# Patient Record
Sex: Male | Born: 1965 | Race: White | Hispanic: No | Marital: Married | Smoking: Never smoker
Health system: Southern US, Community
[De-identification: ages and names within clinical notes are randomized; demographics above are authoritative.]

## PROBLEM LIST (undated history)

## (undated) DIAGNOSIS — N200 Calculus of kidney: Secondary | ICD-10-CM

## (undated) DIAGNOSIS — E785 Hyperlipidemia, unspecified: Secondary | ICD-10-CM

## (undated) DIAGNOSIS — M5430 Sciatica, unspecified side: Secondary | ICD-10-CM

## (undated) DIAGNOSIS — I10 Essential (primary) hypertension: Secondary | ICD-10-CM

## (undated) HISTORY — DX: Sciatica, unspecified side: M54.30

## (undated) HISTORY — PX: VASECTOMY: SHX75

## (undated) HISTORY — PX: HEMORRHOID SURGERY: SHX153

## (undated) HISTORY — DX: Hyperlipidemia, unspecified: E78.5

## (undated) HISTORY — DX: Essential (primary) hypertension: I10

---

## 2006-11-03 ENCOUNTER — Emergency Department: Payer: Self-pay | Admitting: Emergency Medicine

## 2006-11-23 ENCOUNTER — Observation Stay: Payer: Self-pay | Admitting: Surgery

## 2007-01-28 ENCOUNTER — Ambulatory Visit: Payer: Self-pay | Admitting: Surgery

## 2007-05-12 ENCOUNTER — Ambulatory Visit: Payer: Self-pay | Admitting: Gastroenterology

## 2009-05-02 ENCOUNTER — Ambulatory Visit: Payer: Self-pay | Admitting: Surgery

## 2010-05-03 ENCOUNTER — Ambulatory Visit: Payer: Self-pay | Admitting: Family Medicine

## 2012-06-30 DIAGNOSIS — I10 Essential (primary) hypertension: Secondary | ICD-10-CM | POA: Insufficient documentation

## 2012-06-30 DIAGNOSIS — F419 Anxiety disorder, unspecified: Secondary | ICD-10-CM | POA: Insufficient documentation

## 2012-06-30 DIAGNOSIS — K760 Fatty (change of) liver, not elsewhere classified: Secondary | ICD-10-CM | POA: Insufficient documentation

## 2014-03-23 DIAGNOSIS — R739 Hyperglycemia, unspecified: Secondary | ICD-10-CM | POA: Insufficient documentation

## 2014-03-23 DIAGNOSIS — R109 Unspecified abdominal pain: Secondary | ICD-10-CM | POA: Insufficient documentation

## 2015-12-30 DIAGNOSIS — F321 Major depressive disorder, single episode, moderate: Secondary | ICD-10-CM | POA: Insufficient documentation

## 2019-06-17 ENCOUNTER — Other Ambulatory Visit: Payer: Self-pay

## 2019-06-17 ENCOUNTER — Emergency Department: Payer: PRIVATE HEALTH INSURANCE

## 2019-06-17 ENCOUNTER — Emergency Department
Admission: EM | Admit: 2019-06-17 | Discharge: 2019-06-17 | Disposition: A | Discharge status: Home / Self Care | Payer: PRIVATE HEALTH INSURANCE | Attending: Emergency Medicine | Admitting: Emergency Medicine

## 2019-06-17 ENCOUNTER — Encounter: Payer: Self-pay | Admitting: Emergency Medicine

## 2019-06-17 DIAGNOSIS — R319 Hematuria, unspecified: Secondary | ICD-10-CM

## 2019-06-17 DIAGNOSIS — R109 Unspecified abdominal pain: Secondary | ICD-10-CM

## 2019-06-17 DIAGNOSIS — R1011 Right upper quadrant pain: Secondary | ICD-10-CM | POA: Diagnosis present

## 2019-06-17 DIAGNOSIS — N201 Calculus of ureter: Secondary | ICD-10-CM | POA: Diagnosis not present

## 2019-06-17 HISTORY — DX: Calculus of kidney: N20.0

## 2019-06-17 LAB — URINALYSIS, COMPLETE (UACMP) WITH MICROSCOPIC
Bacteria, UA: NONE SEEN
Bilirubin Urine: NEGATIVE
Glucose, UA: NEGATIVE mg/dL
Ketones, ur: NEGATIVE mg/dL
Leukocytes,Ua: NEGATIVE
Nitrite: NEGATIVE
Protein, ur: 100 mg/dL — AB
RBC / HPF: >50 RBC/hpf — ABNORMAL HIGH (ref 0–5)
Specific Gravity, Urine: 1.027 (ref 1.005–1.030)
pH: 5 (ref 5.0–8.0)

## 2019-06-17 MED ORDER — KETOROLAC TROMETHAMINE 10 MG PO TABS: 10.0000 mg | ORAL_TABLET | Freq: Four times a day (QID) | ORAL | 0 refills | Status: DC | PRN

## 2019-06-17 MED ORDER — MORPHINE SULFATE (PF) 4 MG/ML IV SOLN
4.0000 mg | Freq: Once | INTRAVENOUS | Status: AC
  Administered 2019-06-17: 08:00:00 4 mg via INTRAVENOUS
  Filled 2019-06-17: qty 1

## 2019-06-17 MED ORDER — ONDANSETRON 4 MG PO TBDP: 4.0000 mg | ORAL_TABLET | Freq: Three times a day (TID) | ORAL | 0 refills | Status: DC | PRN

## 2019-06-17 MED ORDER — ONDANSETRON HCL 4 MG/2ML IJ SOLN
4.0000 mg | Freq: Once | INTRAMUSCULAR | Status: AC
  Administered 2019-06-17: 08:00:00 4 mg via INTRAVENOUS
  Filled 2019-06-17: qty 2

## 2019-06-17 MED ORDER — KETOROLAC TROMETHAMINE 30 MG/ML IJ SOLN
15.0000 mg | INTRAMUSCULAR | Status: AC
  Administered 2019-06-17: 08:00:00 15 mg via INTRAVENOUS
  Filled 2019-06-17: qty 1

## 2019-06-17 MED ORDER — SODIUM CHLORIDE 0.9 % IV BOLUS
1000.0000 mL | Freq: Once | INTRAVENOUS | Status: AC
  Administered 2019-06-17: 1000 mL via INTRAVENOUS

## 2019-06-17 MED ORDER — TAMSULOSIN HCL 0.4 MG PO CAPS: 0.4000 mg | ORAL_CAPSULE | Freq: Every day | ORAL | 0 refills | Status: AC

## 2019-06-17 MED ORDER — OXYCODONE-ACETAMINOPHEN 5-325 MG PO TABS: 1.0000 | ORAL_TABLET | Freq: Four times a day (QID) | ORAL | 0 refills | Status: AC | PRN

## 2019-06-17 NOTE — ED Provider Notes (Signed)
[facility] Emergency Department Provider Note  ____________________________________________  Time seen: Approximately 10:02 AM  I have reviewed the triage vital signs and the nursing notes.   HISTORY  Chief Complaint Flank Pain    HPI Scott Kane is a 54 y.o. male with a history of kidney stones reports he was in his usual state of health until he was awakened at this morning at 5:45 AM with sharp right flank pain radiating into the right groin.  Associated diaphoresis.  10/10.  Waxing and waning, no aggravating or alleviating factors, feels like a kidney stone.  Denies dysuria fevers or chills.      Past Medical History:  Diagnosis Date  . Kidney stone      There are no problems to display for this patient.    No history of urologic intervention.   Prior to Admission medications   Medication Sig Start Date End Date Taking? Authorizing Provider  ketorolac (TORADOL) 10 MG tablet Take 1 tablet (10 mg total) by mouth every 6 (six) hours as needed for moderate pain. 06/17/19   Carrie Mew, MD  ondansetron (ZOFRAN ODT) 4 MG disintegrating tablet Take 1 tablet (4 mg total) by mouth every 8 (eight) hours as needed for nausea or vomiting. 06/17/19   Carrie Mew, MD  oxyCODONE-acetaminophen (PERCOCET) 5-325 MG tablet Take 1 tablet by mouth every 6 (six) hours as needed for up to 2 days for severe pain. 06/17/19 06/19/19  Carrie Mew, MD  tamsulosin (FLOMAX) 0.4 MG CAPS capsule Take 1 capsule (0.4 mg total) by mouth daily for 10 days. Discontinue after symptoms improve 06/17/19 06/27/19  Carrie Mew, MD     Allergies Patient has no known allergies.   No family history on file.  Social History Social History   Tobacco Use  . Smoking status: Not on file  Substance Use Topics  . Alcohol use: Not on file  . Drug use: Not on file    Review of Systems  Constitutional:   No fever or chills.  ENT:   No sore throat. No  rhinorrhea. Cardiovascular:   No chest pain or syncope. Respiratory:   No dyspnea or cough. Gastrointestinal:   Negative for abdominal pain, vomiting and diarrhea.  Musculoskeletal:   Right flank pain as above. All other systems reviewed and are negative except as documented above in ROS and HPI.  ____________________________________________   PHYSICAL EXAM:  VITAL SIGNS: ED Triage Vitals  Enc Vitals Group     BP 06/17/19 0714 (!) 220/111     Pulse Rate 06/17/19 0705 77     Resp 06/17/19 0705 18     Temp 06/17/19 0705 98.5 F (36.9 C)     Temp Source 06/17/19 0705 Oral     SpO2 06/17/19 0705 94 %     Weight 06/17/19 0702 230 lb (104.3 kg)     Height 06/17/19 0702 5\' 7"  (1.702 m)     Head Circumference --      Peak Flow --      Pain Score 06/17/19 0702 10     Pain Loc --      Pain Edu? --      Excl. in [place]? --     Vital signs reviewed, nursing assessments reviewed.   Constitutional:   Alert and oriented. Non-toxic appearance. Eyes:   Conjunctivae are normal. EOMI. PERRL. ENT      Head:   Normocephalic and atraumatic.      Nose:   Wearing a mask.  Mouth/Throat:   Wearing a mask.      Neck:   No meningismus. Full ROM. Hematological/Lymphatic/Immunilogical:   No cervical lymphadenopathy. Cardiovascular:   RRR. Symmetric bilateral radial and DP pulses.  No murmurs. Cap refill less than 2 seconds. Respiratory:   Normal respiratory effort without tachypnea/retractions. Breath sounds are clear and equal bilaterally. No wheezes/rales/rhonchi. Gastrointestinal:   Soft with right lower quadrant tenderness. Non distended. There is no CVA tenderness.  No rebound, rigidity, or guarding.  Musculoskeletal:   Normal range of motion in all extremities. No joint effusions.  No lower extremity tenderness.  No edema. Neurologic:   Normal speech and language.  Motor grossly intact. No acute focal neurologic deficits are appreciated.  Skin:    Skin is warm, dry and intact. No rash  noted.  No petechiae, purpura, or bullae.  ____________________________________________    LABS (pertinent positives/negatives) (all labs ordered are listed, but only abnormal results are displayed) Labs Reviewed  URINALYSIS, COMPLETE (UACMP) WITH MICROSCOPIC - Abnormal; Notable for the following components:      Result Value   Color, Urine AMBER (*)    APPearance CLOUDY (*)    Hgb urine dipstick LARGE (*)    Protein, ur 100 (*)    RBC / HPF >50 (*)    All other components within normal limits  URINE CULTURE   ____________________________________________   EKG    ____________________________________________    RADIOLOGY  CT Renal Stone Study  Result Date: 06/17/2019 CLINICAL DATA:  Acute right flank pain. EXAM: CT ABDOMEN AND PELVIS WITHOUT CONTRAST TECHNIQUE: Multidetector CT imaging of the abdomen and pelvis was performed following the standard protocol without IV contrast. COMPARISON:  None. FINDINGS: Lower chest: No acute abnormality. Hepatobiliary: No focal liver abnormality is seen. No gallstones, gallbladder wall thickening, or biliary dilatation. Pancreas: Unremarkable. No pancreatic ductal dilatation or surrounding inflammatory changes. Spleen: Normal in size without focal abnormality. Adrenals/Urinary Tract: Adrenal glands appear normal. Mild right hydroureteronephrosis is noted secondary to 5 mm distal right ureteral calculus. Multiple rounded low densities are noted most consistent with cysts, but renal ultrasound is recommended to rule out potential mass. Urinary bladder is decompressed. Stomach/Bowel: Stomach is within normal limits. Appendix appears normal. No evidence of bowel wall thickening, distention, or inflammatory changes. Vascular/Lymphatic: No significant vascular findings are present. No enlarged abdominal or pelvic lymph nodes. Reproductive: Prostate is unremarkable. Other: No abdominal wall hernia or abnormality. No abdominopelvic ascites. Musculoskeletal:  No acute or significant osseous findings. IMPRESSION: 1. Mild right hydroureteronephrosis is noted secondary to 5 mm distal right ureteral calculus. 2. Multiple rounded low densities are noted in both kidneys most consistent with cysts, but renal ultrasound is recommended to rule out potential mass. Electronically Signed   By: Lupita Raider M.D.   On: 06/17/2019 08:25    ____________________________________________   PROCEDURES Procedures  ____________________________________________  DIFFERENTIAL DIAGNOSIS   Ureterolithiasis, ureteral obstruction, appendicitis, diverticulitis  CLINICAL IMPRESSION / ASSESSMENT AND PLAN / ED COURSE  Medications ordered in the ED: Medications  sodium chloride 0.9 % bolus 1,000 mL (0 mLs Intravenous Stopped 06/17/19 0930)  morphine 4 MG/ML injection 4 mg (4 mg Intravenous Given 06/17/19 0745)  ondansetron (ZOFRAN) injection 4 mg (4 mg Intravenous Given 06/17/19 0745)  ketorolac (TORADOL) 30 MG/ML injection 15 mg (15 mg Intravenous Given 06/17/19 2957)    Pertinent labs & imaging results that were available during my care of the patient were reviewed by me and considered in my medical decision making (see chart for  details).  Scott Kane was evaluated in Emergency Department on 06/17/2019 for the symptoms described in the history of present illness. He was evaluated in the context of the global COVID-19 pandemic, which necessitated consideration that the patient might be at risk for infection with the SARS-CoV-2 virus that causes COVID-19. Institutional protocols and algorithms that pertain to the evaluation of patients at risk for COVID-19 are in a state of rapid change based on information released by regulatory bodies including the CDC and federal and state organizations. These policies and algorithms were followed during the patient's care in the ED.   Patient presents with severe right flank pain, most likely renal colic.  However with location and  tenderness, CT scan obtained which shows no evidence of appendicitis or other inflammatory process, but does confirm a 5 mm stone in the distal right ureter explaining the symptoms.  He has hematuria as well.  Will treat with Toradol, Zofran, short course of Percocet as needed.  Follow-up with urology and primary care for further management and future urinary testing.      ____________________________________________   FINAL CLINICAL IMPRESSION(S) / ED DIAGNOSES    Final diagnoses:  Right flank pain  Hematuria, unspecified type  Ureterolithiasis     ED Discharge Orders         Ordered    ketorolac (TORADOL) 10 MG tablet  Every 6 hours PRN     06/17/19 1002    oxyCODONE-acetaminophen (PERCOCET) 5-325 MG tablet  Every 6 hours PRN     06/17/19 1002    tamsulosin (FLOMAX) 0.4 MG CAPS capsule  Daily     06/17/19 1002    ondansetron (ZOFRAN ODT) 4 MG disintegrating tablet  Every 8 hours PRN     06/17/19 1002          Portions of this note were generated with dragon dictation software. Dictation errors may occur despite best attempts at proofreading.   Sharman Cheek, MD 06/17/19 1005

## 2019-06-17 NOTE — ED Triage Notes (Signed)
Pt to triage via w/c, diaphoretic, appears uncomfortable; pt reports rt flank pain radiating around into groin/testicle since 545am; st hx kidney stones

## 2019-06-17 NOTE — Discharge Instructions (Signed)
Your CT scan shows a 5 mm kidney stone on the right which is near your bladder.  Once it passes into your bladder, your symptoms should significantly improve.  Continue to drink lots of fluids to stay well-hydrated and take medicines as prescribed for your pain.  Follow-up with urology next week.  You should have a repeat urine test in a week or so after you are feeling back to normal to ensure the blood in your urine has resolved.

## 2019-06-18 LAB — URINE CULTURE

## 2020-07-22 IMAGING — CT CT RENAL STONE PROTOCOL
2 of 4 series · 16 of 46 positions shown, 18 images · non-contrast
Comparison: None.

CLINICAL DATA: Acute right flank pain.

EXAM:
CT ABDOMEN AND PELVIS WITHOUT CONTRAST
TECHNIQUE: Multidetector CT imaging of the abdomen and pelvis was performed
following the standard protocol without IV contrast.

[Series 2: stone full standard · axial · 0.79mm/px · z∈[-517,-67]mm · 13 of 100 slices shown, 15 images]
[im 5/100  soft-tissue]
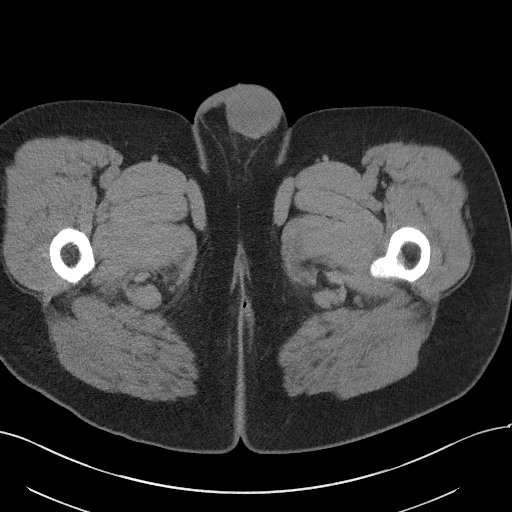
[im 5/100  bone]
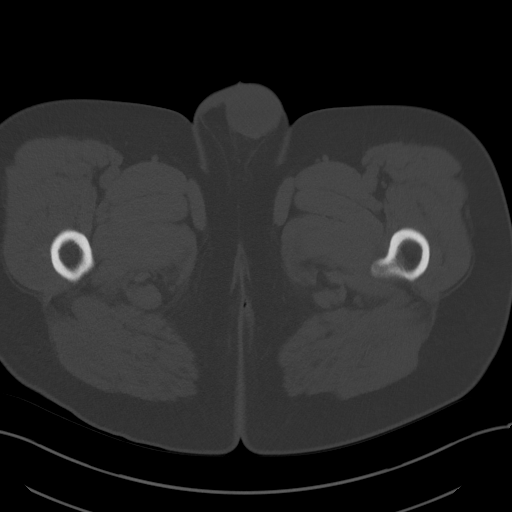
[im 13/100  soft-tissue]
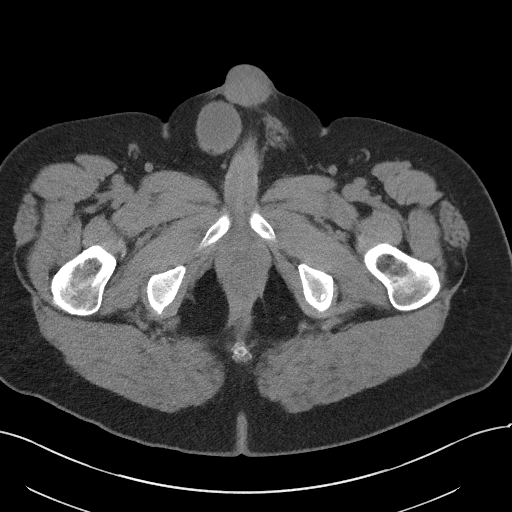
[im 22/100  soft-tissue]
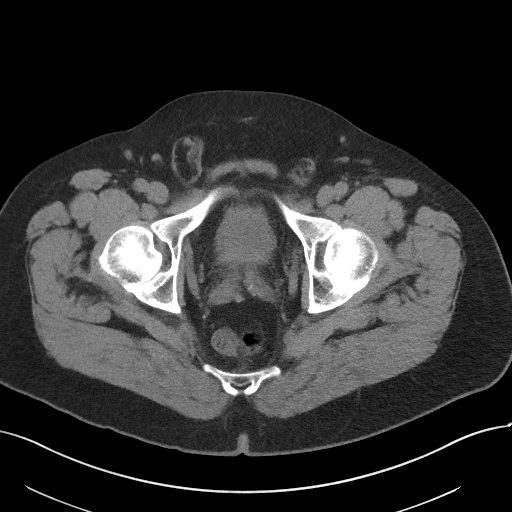
[im 26/100  soft-tissue]
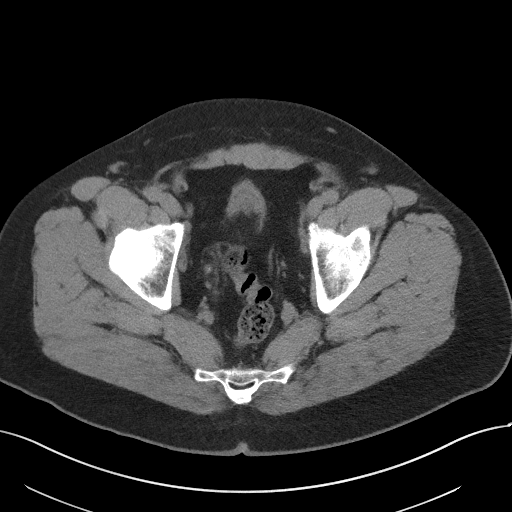
[im 35/100  soft-tissue]
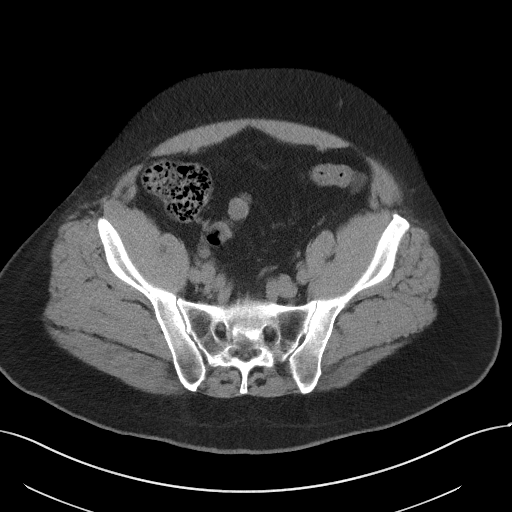
[im 44/100  soft-tissue]
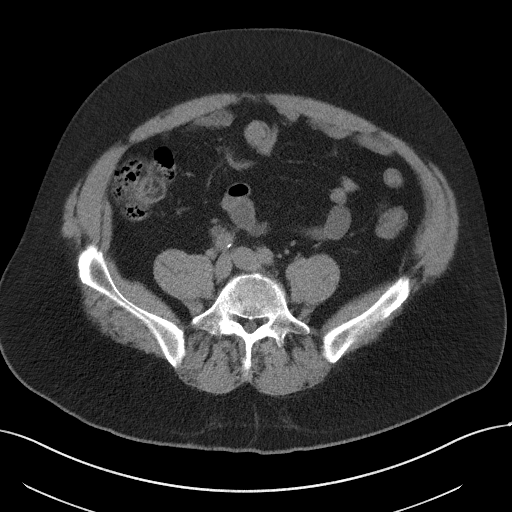
[im 52/100  soft-tissue]
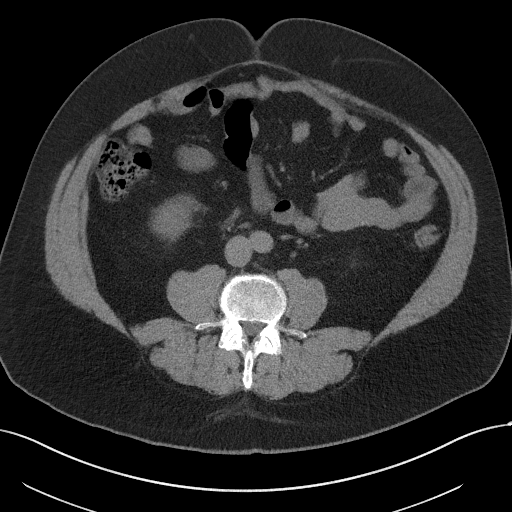
[im 56/100  soft-tissue]
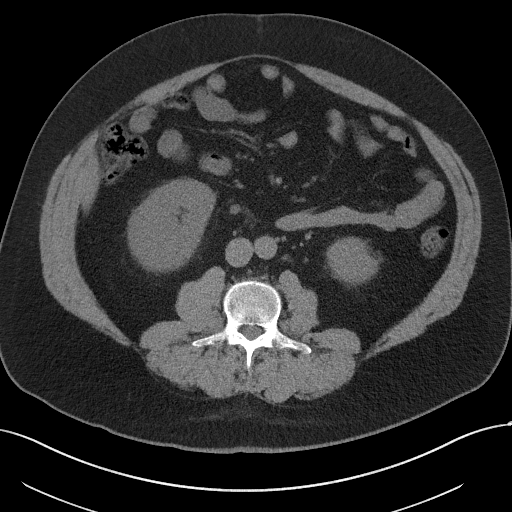
[im 65/100  soft-tissue]
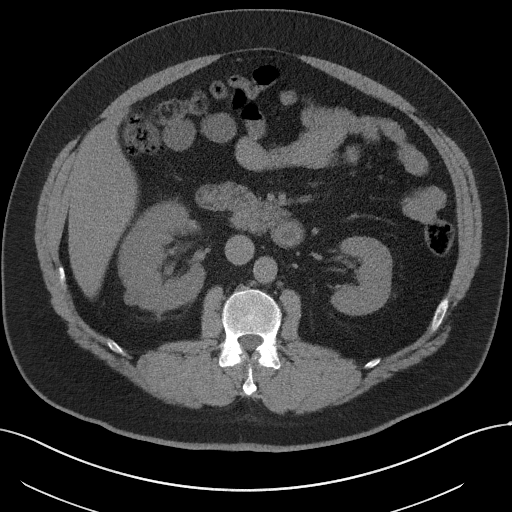
[im 65/100  bone]
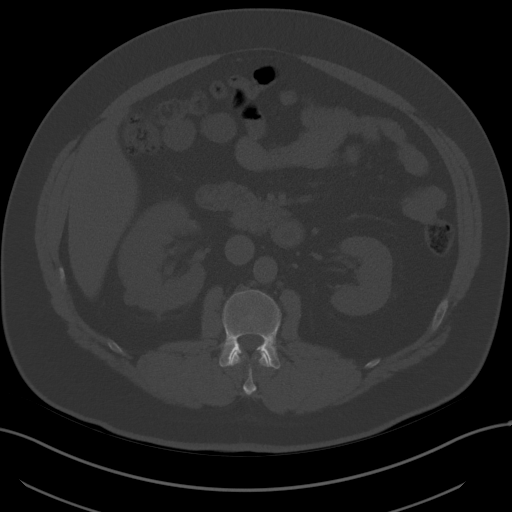
[im 74/100  soft-tissue]
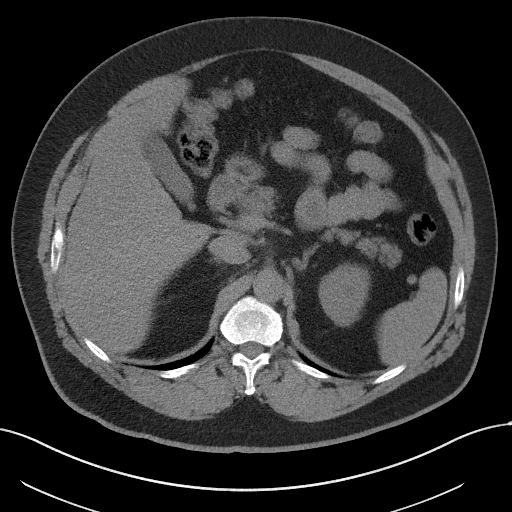
[im 78/100  soft-tissue]
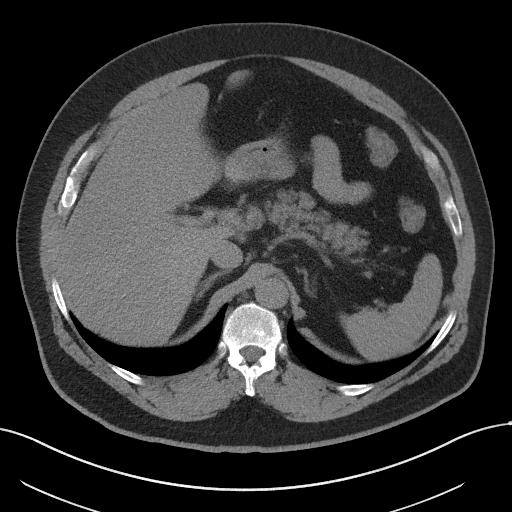
[im 87/100  soft-tissue]
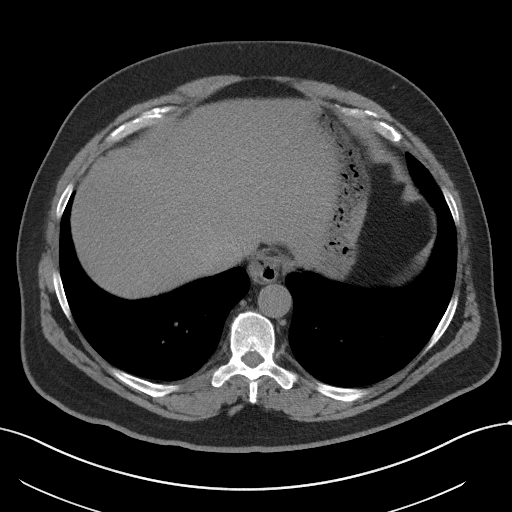
[im 95/100  soft-tissue]
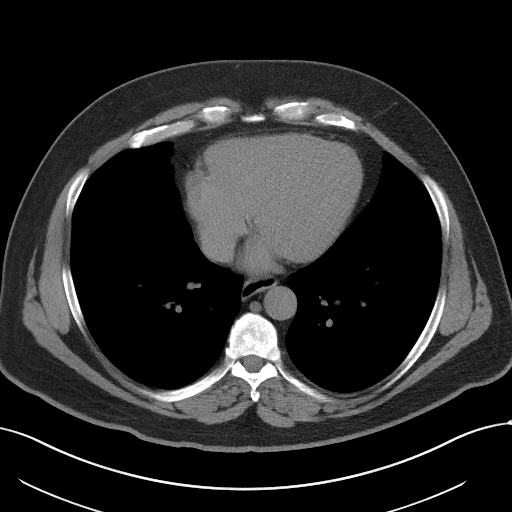

[Series 5: coronal · coronal · 0.92mm/px · 3 of 179 slices shown]
[im 60/179  soft-tissue]
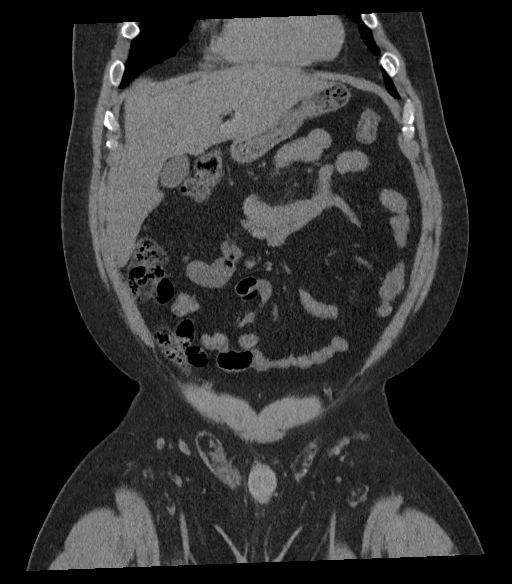
[im 80/179  soft-tissue]
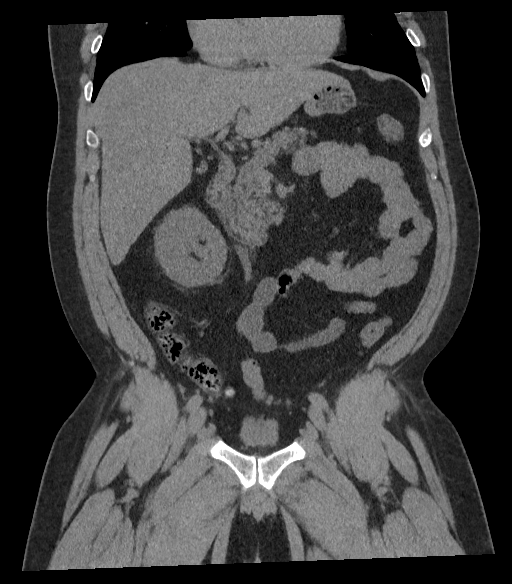
[im 99/179  soft-tissue]
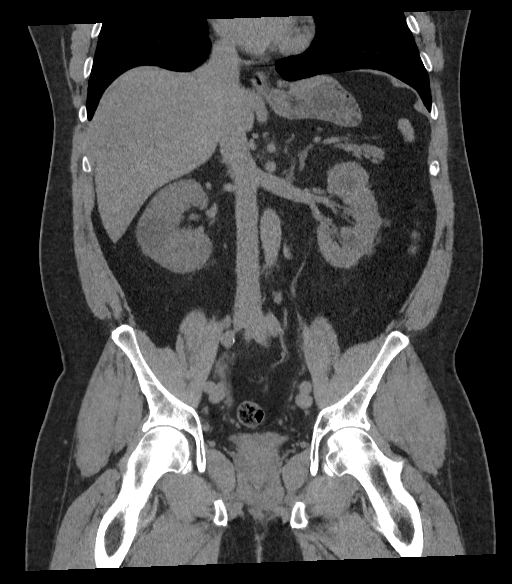

[16 of 46 positions shown; findings below may reference images not displayed]

FINDINGS: Lower chest: No acute abnormality.

Hepatobiliary: No focal liver abnormality is seen. No gallstones,
gallbladder wall thickening, or biliary dilatation.

Pancreas: Unremarkable. No pancreatic ductal dilatation or
surrounding inflammatory changes.

Spleen: Normal in size without focal abnormality.

Adrenals/Urinary Tract: Adrenal glands appear normal. Mild right
hydroureteronephrosis is noted secondary to 5 mm distal right
ureteral calculus. Multiple rounded low densities are noted most
consistent with cysts, but renal ultrasound is recommended to rule
out potential mass. Urinary bladder is decompressed.

Stomach/Bowel: Stomach is within normal limits. Appendix appears
normal. No evidence of bowel wall thickening, distention, or
inflammatory changes.

Vascular/Lymphatic: No significant vascular findings are present. No
enlarged abdominal or pelvic lymph nodes.

Reproductive: Prostate is unremarkable.

Other: No abdominal wall hernia or abnormality. No abdominopelvic
ascites.

Musculoskeletal: No acute or significant osseous findings.
IMPRESSION: 1. Mild right hydroureteronephrosis is noted secondary to 5 mm
distal right ureteral calculus.
2. Multiple rounded low densities are noted in both kidneys most
consistent with cysts, but renal ultrasound is recommended to rule
out potential mass.

## 2020-10-04 DIAGNOSIS — G43909 Migraine, unspecified, not intractable, without status migrainosus: Secondary | ICD-10-CM | POA: Insufficient documentation

## 2020-11-02 DIAGNOSIS — N62 Hypertrophy of breast: Secondary | ICD-10-CM | POA: Insufficient documentation

## 2021-06-23 ENCOUNTER — Ambulatory Visit
Admission: EM | Admit: 2021-06-23 | Discharge: 2021-06-23 | Disposition: A | Discharge status: Home / Self Care | Payer: 59

## 2021-06-23 ENCOUNTER — Encounter: Payer: Self-pay | Admitting: Emergency Medicine

## 2021-06-23 ENCOUNTER — Other Ambulatory Visit: Payer: Self-pay

## 2021-06-23 DIAGNOSIS — L0100 Impetigo, unspecified: Secondary | ICD-10-CM

## 2021-06-23 DIAGNOSIS — B3749 Other urogenital candidiasis: Secondary | ICD-10-CM

## 2021-06-23 MED ORDER — CEPHALEXIN 500 MG PO CAPS: 500.0000 mg | ORAL_CAPSULE | Freq: Two times a day (BID) | ORAL | 0 refills | Status: AC

## 2021-06-23 MED ORDER — FLUCONAZOLE 150 MG PO TABS: 150.0000 mg | ORAL_TABLET | Freq: Every day | ORAL | 0 refills | Status: AC

## 2021-06-23 MED ORDER — PREDNISONE 20 MG PO TABS: 40.0000 mg | ORAL_TABLET | Freq: Every day | ORAL | 0 refills | Status: DC

## 2021-06-23 NOTE — Discharge Instructions (Signed)
For your wrist  -Take Keflex twice a day for the next 5 days to cover for bacterial infection -Starting tomorrow begin use of prednisone every morning for the next 5 days to help reduce inflammatory process  -You may use an antihistamine such as Benadryl, Claritin or Zyrtec to help reduce itching   For your penis -Take Diflucan once then in 72 hours (3 days) if symptoms are still present you may take second dose -As always continue good hygiene to prevent reoccurring infection Wash your penis with soap and water every day. If you are not circumcised, pull back the foreskin to wash. Make sure to dry your penis completely after washing. Wear breathable, cotton underwear. Keep your underwear clean and dry.

## 2021-06-23 NOTE — ED Triage Notes (Signed)
Pt has a blistery rash on his right wrist. Started about 3 days ago. He states it is itchy. Blisters have started draining.

## 2021-06-23 NOTE — ED Provider Notes (Signed)
[facility]    CSN: [id number] Arrival date & time: 06/23/21  1201      History   Chief Complaint Chief Complaint  Patient presents with   Rash    HPI Scott Kane is a 56 y.o. male.   Patient presents with Scott Kane thick discharge, skin feeling and cracking to the penile head and shaft for 3 weeks.  Has attempted use of over-the-counter Monistat and another antifungal which has not been effective.  Endorses he is uncircumcised and has had yeast infections in the past.  Denies penile drainage, penile or testicle swelling, dysuria, urinary frequency or urgency, abdominal pain, flank pain, new rashes or lesions.  Patient concerned with blisterlike erythematous rash to the right wrist for 3 days.  Rash is pruritic and has begun draining clear like fluid . symptoms began after taking his daily walk, wearing watch at the time.  Has attempted use of tea tree oil with no improvement.   Past Medical History:  Diagnosis Date   Kidney stone     There are no problems to display for this patient.   History reviewed. No pertinent surgical history.     Home Medications    Prior to Admission medications   Medication Sig Start Date End Date Taking? Authorizing Provider  losartan (COZAAR) 100 MG tablet Take by mouth. 10/04/20 10/04/21 Yes [provider]  spironolactone (ALDACTONE) 50 MG tablet Take 50 mg by mouth daily. 06/17/21  Yes [provider]  ketorolac (TORADOL) 10 MG tablet Take 1 tablet (10 mg total) by mouth every 6 (six) hours as needed for moderate pain. 06/17/19   Carrie Mew, MD  ondansetron (ZOFRAN ODT) 4 MG disintegrating tablet Take 1 tablet (4 mg total) by mouth every 8 (eight) hours as needed for nausea or vomiting. 06/17/19   Carrie Mew, MD  spironolactone (ALDACTONE) 100 MG tablet Take by mouth. 10/04/20 10/04/21  [provider]    Family History No family history on file.  Social History Social History    Tobacco Use   Smoking status: Never   Smokeless tobacco: Never  Vaping Use   Vaping Use: Never used  Substance Use Topics   Alcohol use: Not Currently   Drug use: Yes    Types: Marijuana     Allergies   Patient has no known allergies.   Review of Systems Review of Systems  Constitutional: Negative.   Respiratory: Negative.    Cardiovascular: Negative.   Skin:  Positive for rash. Negative for color change, pallor and wound.  Neurological: Negative.     Physical Exam Triage Vital Signs ED Triage Vitals  Enc Vitals Group     BP 06/23/21 1313 (!) 146/93     Pulse Rate 06/23/21 1313 69     Resp 06/23/21 1313 18     Temp 06/23/21 1313 98.4 F (36.9 C)     Temp Source 06/23/21 1313 Oral     SpO2 06/23/21 1313 100 %     Weight 06/23/21 1310 229 lb 15 oz (104.3 kg)     Height 06/23/21 1310 5\' 7"  (1.702 m)     Head Circumference --      Peak Flow --      Pain Score 06/23/21 1309 0     Pain Loc --      Pain Edu? --      Excl. in [place]? --    No data found.  Updated Vital Signs BP (!) 146/93 (BP Location: Left Arm)  Pulse 69    Temp 98.4 F (36.9 C) (Oral)    Resp 18    Ht 5\' 7"  (1.702 m)    Wt 229 lb 15 oz (104.3 kg)    SpO2 100%    BMI 36.01 kg/m   Visual Acuity Right Eye Distance:   Left Eye Distance:   Bilateral Distance:    Right Eye Near:   Left Eye Near:    Bilateral Near:     Physical Exam Constitutional:      Appearance: Normal appearance.  HENT:     Head: Normocephalic.  Eyes:     Extraocular Movements: Extraocular movements intact.  Pulmonary:     Effort: Pulmonary effort is normal.  Genitourinary:    Comments: [name] thick curd-like discharge, skin peeling and cracking present to the top of the penile shaft Skin:    Comments: Erythematous pustular rash with golden yellow crusting present to the posterior aspect of the right wrist,, some involvement to the anterior aspect as well  Neurological:     Mental Status: He is alert and oriented to  person, place, and time. Mental status is at baseline.  Psychiatric:        Mood and Affect: Mood normal.        Behavior: Behavior normal.     UC Treatments / Results  Labs (all labs ordered are listed, but only abnormal results are displayed) Labs Reviewed - No data to display  EKG   Radiology No results found.  Procedures Procedures (including critical care time)  Medications Ordered in UC Medications - No data to display  Initial Impression / Assessment and Plan / UC Course  I have reviewed the triage vital signs and the nursing notes.  Pertinent labs & imaging results that were available during my care of the patient were reviewed by me and considered in my medical decision making (see chart for details).  Impetigo Yeast dermatitis of penis  Rash is consistent with impetigo on presentation, discussed with patient, Keflex 5-day course as well as prednisone 5-day course prescribed for treatment, may use antihistamine as needed for itching, advised patient to avoid touching to prevent spread, may follow-up with urgent care as needed  Diflucan 150 mg once then as needed in 3 days for treatment of yeast, recommended continued good penile hygiene to avoid recurrence, may follow-up with urgent care as needed Final Clinical Impressions(s) / UC Diagnoses   Final diagnoses:  None   Discharge Instructions   None    ED Prescriptions   None    PDMP not reviewed this encounter.   Hans Eden, NP 06/23/21 1347

## 2021-12-25 ENCOUNTER — Other Ambulatory Visit: Payer: Self-pay | Admitting: Physical Medicine & Rehabilitation

## 2021-12-25 DIAGNOSIS — M5416 Radiculopathy, lumbar region: Secondary | ICD-10-CM

## 2022-01-01 ENCOUNTER — Ambulatory Visit
Admission: RE | Admit: 2022-01-01 | Discharge: 2022-01-01 | Disposition: A | Discharge status: Home / Self Care | Payer: 59 | Source: Ambulatory Visit | Attending: Physical Medicine & Rehabilitation | Admitting: Physical Medicine & Rehabilitation

## 2022-01-01 DIAGNOSIS — M5416 Radiculopathy, lumbar region: Secondary | ICD-10-CM | POA: Diagnosis present

## 2022-08-22 ENCOUNTER — Telehealth: Payer: Self-pay | Admitting: *Deleted

## 2022-08-22 NOTE — Telephone Encounter (Signed)
Nurse placed call to patient to review appointment details for upcoming new hematology consultation visit. Appointment time and details confirmed with patient. Patient denies any questions or concerns.

## 2022-08-23 ENCOUNTER — Inpatient Hospital Stay: Payer: 59

## 2022-08-23 ENCOUNTER — Inpatient Hospital Stay: Payer: 59 | Attending: Oncology | Admitting: Oncology

## 2022-08-23 ENCOUNTER — Encounter: Payer: Self-pay | Admitting: Oncology

## 2022-08-23 VITALS — BP 142/94 | HR 58 | Temp 96.6°F | Resp 16 | Ht 67.0 in | Wt 221.0 lb

## 2022-08-23 DIAGNOSIS — D72829 Elevated white blood cell count, unspecified: Secondary | ICD-10-CM | POA: Diagnosis not present

## 2022-08-23 DIAGNOSIS — N200 Calculus of kidney: Secondary | ICD-10-CM | POA: Insufficient documentation

## 2022-08-23 DIAGNOSIS — E669 Obesity, unspecified: Secondary | ICD-10-CM | POA: Insufficient documentation

## 2022-08-23 LAB — CBC WITH DIFFERENTIAL/PLATELET
Abs Immature Granulocytes: 0.05 10*3/uL (ref 0.00–0.07)
Basophils Absolute: 0.1 10*3/uL (ref 0.0–0.1)
Basophils Relative: 1 %
Eosinophils Absolute: 0.2 10*3/uL (ref 0.0–0.5)
Eosinophils Relative: 2 %
HCT: 42.8 % (ref 39.0–52.0)
Hemoglobin: 14.7 g/dL (ref 13.0–17.0)
Immature Granulocytes: 0 %
Lymphocytes Relative: 53 %
Lymphs Abs: 6.7 10*3/uL — ABNORMAL HIGH (ref 0.7–4.0)
MCH: 29.4 pg (ref 26.0–34.0)
MCHC: 34.3 g/dL (ref 30.0–36.0)
MCV: 85.6 fL (ref 80.0–100.0)
Monocytes Absolute: 0.8 10*3/uL (ref 0.1–1.0)
Monocytes Relative: 6 %
Neutro Abs: 4.8 10*3/uL (ref 1.7–7.7)
Neutrophils Relative %: 38 %
Platelets: 271 10*3/uL (ref 150–400)
RBC: 5 MIL/uL (ref 4.22–5.81)
RDW: 12.3 % (ref 11.5–15.5)
WBC: 12.6 10*3/uL — ABNORMAL HIGH (ref 4.0–10.5)
nRBC: 0 % (ref 0.0–0.2)

## 2022-08-23 LAB — IRON AND TIBC
Iron: 107 ug/dL (ref 45–182)
Saturation Ratios: 27 % (ref 17.9–39.5)
TIBC: 395 ug/dL (ref 250–450)
UIBC: 288 ug/dL

## 2022-08-23 LAB — FERRITIN: Ferritin: 152 ng/mL (ref 24–336)

## 2022-08-23 NOTE — Progress Notes (Signed)
[facility]  Telephone:([phone] [phone]  ID: Magdalene Patricia OB: Aug 08, 1965  MR#: [id number]  AOZ#:[id number]  Patient Care Team: [facility] as PCP - General  CHIEF COMPLAINT: Leukocytosis.  INTERVAL HISTORY: Patient is a 57 year old male who was noted to have a mildly elevated white blood cell count.  Repeat laboratory work confirmed the results.  He is referred for further evaluation.  He currently feels well and is asymptomatic.  He denies any new medications.  He denies any recent fevers or illnesses.  He has a good appetite and denies weight loss.  He has no neurologic complaints.  He denies any chest pain, shortness of breath, cough, or hemoptysis.  He denies any nausea, vomiting, constipation, or diarrhea.  He has no urinary complaints.  Patient feels at his baseline and offers no specific complaints today.  REVIEW OF SYSTEMS:   Review of Systems  Constitutional: Negative.  Negative for fever, malaise/fatigue and weight loss.  Respiratory: Negative.  Negative for cough, hemoptysis and shortness of breath.   Cardiovascular: Negative.  Negative for chest pain and leg swelling.  Gastrointestinal: Negative.  Negative for abdominal pain.  Genitourinary: Negative.  Negative for dysuria.  Musculoskeletal: Negative.  Negative for back pain.  Skin: Negative.  Negative for rash.  Neurological: Negative.  Negative for dizziness, focal weakness, weakness and headaches.  Psychiatric/Behavioral: Negative.  The patient is not nervous/anxious.     As per HPI. Otherwise, a complete review of systems is negative.  PAST MEDICAL HISTORY: Past Medical History:  Diagnosis Date   Hyperlipidemia    Hypertension    Kidney stone    Sciatica     PAST SURGICAL HISTORY: Past Surgical History:  Procedure Laterality Date   HEMORRHOID SURGERY     VASECTOMY      FAMILY HISTORY: Family History  Problem Relation Age of Onset   Diabetes Brother     Cancer Maternal Aunt    Cancer Maternal Grandmother        colon cancer    ADVANCED DIRECTIVES (Y/N):  N  HEALTH MAINTENANCE: Social History   Tobacco Use   Smoking status: Never   Smokeless tobacco: Never  Vaping Use   Vaping Use: Never used  Substance Use Topics   Alcohol use: Not Currently   Drug use: Yes    Types: Marijuana     Colonoscopy:  PAP:  Bone density:  Lipid panel:  No Known Allergies  Current Outpatient Medications  Medication Sig Dispense Refill   atorvastatin (LIPITOR) 20 MG tablet Take 20 mg by mouth daily.     cephALEXin (KEFLEX) 500 MG capsule Take 1 capsule (500 mg total) by mouth 2 (two) times daily. 20 capsule 0   gabapentin (NEURONTIN) 300 MG capsule Take 1 capsule by mouth 2 (two) times daily.     loratadine (CLARITIN) 10 MG tablet Take 10 mg by mouth daily.     losartan (COZAAR) 100 MG tablet Take by mouth.     spironolactone (ALDACTONE) 100 MG tablet Take by mouth.     spironolactone (ALDACTONE) 50 MG tablet Take 50 mg by mouth daily. (Patient not taking: Reported on 08/23/2022)     No current facility-administered medications for this visit.    OBJECTIVE: Vitals:   08/23/22 1337  BP: (!) 142/94  Pulse: (!) 58  Resp: 16  Temp: (!) 96.6 F (35.9 C)  SpO2: 99%     Body mass index is 34.61 kg/m.    ECOG FS:0 -  Asymptomatic  General: Well-developed, well-nourished, no acute distress. Eyes: Pink conjunctiva, anicteric sclera. HEENT: Normocephalic, moist mucous membranes. Lungs: No audible wheezing or coughing. Heart: Regular rate and rhythm. Abdomen: Soft, nontender, no obvious distention. Musculoskeletal: No edema, cyanosis, or clubbing. Neuro: Alert, answering all questions appropriately. Cranial nerves grossly intact. Skin: No rashes or petechiae noted. Psych: Normal affect. Lymphatics: No cervical, calvicular, axillary or inguinal LAD.   LAB RESULTS:  No results found for: "NA", "K", "CL", "CO2", "GLUCOSE", "BUN",  "CREATININE", "CALCIUM", "PROT", "ALBUMIN", "AST", "ALT", "ALKPHOS", "BILITOT", "GFRNONAA", "GFRAA"  Lab Results  Component Value Date   WBC 12.6 (H) 08/23/2022   NEUTROABS 4.8 08/23/2022   HGB 14.7 08/23/2022   HCT 42.8 08/23/2022   MCV 85.6 08/23/2022   PLT 271 08/23/2022     STUDIES: No results found.  ASSESSMENT: Leukocytosis.  PLAN:    Leukocytosis: Chronic and unchanged.  Patient's total white blood cell count is mildly elevated at 12.6.  He is noted to have a lymphocyte predominance.  Peripheral blood flow cytometry as well as BCR-ABL mutation have been ordered for completeness and are pending at time of dictation.  No intervention is needed at this time.  Patient does not require bone marrow biopsy.  He will have a video assisted telemedicine visit in 3 weeks for further evaluation and discussion of his results.  I spent a total of 45 minutes reviewing chart data, face-to-face evaluation with the patient, counseling and coordination of care as detailed above.   Patient expressed understanding and was in agreement with this plan. He also understands that He can call clinic at any time with any questions, concerns, or complaints.    Cancer Staging  No matching staging information was found for the patient.  Jeralyn Ruths, MD   08/23/2022 2:56 PM

## 2022-08-23 NOTE — Progress Notes (Signed)
Had a couple of recent CBC readings with elevated WBC count. He requested a referral to make sure there wasn't any further tested that needed to be done.

## 2022-08-27 LAB — COMP PANEL: LEUKEMIA/LYMPHOMA: Immunophenotypic Profile: 26

## 2022-09-02 LAB — BCR-ABL1, CML/ALL, PCR, QUANT: Interpretation (BCRAL):: NEGATIVE

## 2022-09-11 ENCOUNTER — Inpatient Hospital Stay: Payer: 59 | Attending: Oncology | Admitting: Oncology

## 2022-09-11 DIAGNOSIS — C9110 Chronic lymphocytic leukemia of B-cell type not having achieved remission: Secondary | ICD-10-CM | POA: Insufficient documentation

## 2022-09-11 NOTE — Progress Notes (Signed)
[facility]  [phone] [phone]  ID: Scott Kane OB: 1965/06/30  MR#: [id number]  AOZ#:[id number]  Patient Care Team: [facility] as PCP - General  I connected with Scott Kane on 09/11/22 at  3:30 PM EDT by video enabled telemedicine visit and verified that I am speaking with the correct person using two identifiers.   I discussed the limitations, risks, security and privacy concerns of performing an evaluation and management service by telemedicine and the availability of in-person appointments. I also discussed with the patient that there may be a patient responsible charge related to this service. The patient expressed understanding and agreed to proceed.   Other persons participating in the visit and their role in the encounter: Patient, MD.  Patient's location: Home. Provider's location: Clinic.  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  INTERVAL HISTORY: Patient agreed to video assisted telemedicine visit for further evaluation and discussion of his laboratory results.  He continues to feel well and is asymptomatic.  He denies any recent fevers or illnesses.  He has a good appetite and denies weight loss.  He has no neurologic complaints.  He denies any chest pain, shortness of breath, cough, or hemoptysis.  He denies any nausea, vomiting, constipation, or diarrhea.  He has no urinary complaints.  Patient offers no specific complaints today.  REVIEW OF SYSTEMS:   Review of Systems  Constitutional: Negative.  Negative for fever, malaise/fatigue and weight loss.  Respiratory: Negative.  Negative for cough, hemoptysis and shortness of breath.   Cardiovascular: Negative.  Negative for chest pain and leg swelling.  Gastrointestinal: Negative.  Negative for abdominal pain.  Genitourinary: Negative.  Negative for dysuria.  Musculoskeletal: Negative.  Negative for back pain.  Skin: Negative.  Negative for rash.  Neurological:  Negative.  Negative for dizziness, focal weakness, weakness and headaches.  Psychiatric/Behavioral: Negative.  The patient is not nervous/anxious.     As per HPI. Otherwise, a complete review of systems is negative.  PAST MEDICAL HISTORY: Past Medical History:  Diagnosis Date   Hyperlipidemia    Hypertension    Kidney stone    Sciatica     PAST SURGICAL HISTORY: Past Surgical History:  Procedure Laterality Date   HEMORRHOID SURGERY     VASECTOMY      FAMILY HISTORY: Family History  Problem Relation Age of Onset   Diabetes Brother    Cancer Maternal Aunt    Cancer Maternal Grandmother        colon cancer    ADVANCED DIRECTIVES (Y/N):  N  HEALTH MAINTENANCE: Social History   Tobacco Use   Smoking status: Never   Smokeless tobacco: Never  Vaping Use   Vaping Use: Never used  Substance Use Topics   Alcohol use: Not Currently   Drug use: Yes    Types: Marijuana     Colonoscopy:  PAP:  Bone density:  Lipid panel:  No Known Allergies  Current Outpatient Medications  Medication Sig Dispense Refill   atorvastatin (LIPITOR) 20 MG tablet Take 20 mg by mouth daily.     cephALEXin (KEFLEX) 500 MG capsule Take 1 capsule (500 mg total) by mouth 2 (two) times daily. 20 capsule 0   gabapentin (NEURONTIN) 300 MG capsule Take 1 capsule by mouth 2 (two) times daily.     loratadine (CLARITIN) 10 MG tablet Take 10 mg by mouth daily.     losartan (COZAAR) 100 MG tablet Take by mouth.     spironolactone (ALDACTONE) 100  MG tablet Take by mouth.     spironolactone (ALDACTONE) 50 MG tablet Take 50 mg by mouth daily. (Patient not taking: Reported on 08/23/2022)     No current facility-administered medications for this visit.    OBJECTIVE: There were no vitals filed for this visit.    There is no height or weight on file to calculate BMI.    ECOG FS:0 - Asymptomatic  General: Well-developed, well-nourished, no acute distress. HEENT: Normocephalic. Neuro: Alert, answering  all questions appropriately. Cranial nerves grossly intact. Psych: Normal affect.  LAB RESULTS:  No results found for: "NA", "K", "CL", "CO2", "GLUCOSE", "BUN", "CREATININE", "CALCIUM", "PROT", "ALBUMIN", "AST", "ALT", "ALKPHOS", "BILITOT", "GFRNONAA", "GFRAA"  Lab Results  Component Value Date   WBC 12.6 (H) 08/23/2022   NEUTROABS 4.8 08/23/2022   HGB 14.7 08/23/2022   HCT 42.8 08/23/2022   MCV 85.6 08/23/2022   PLT 271 08/23/2022     STUDIES: No results found.  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  PLAN:    Likely CLL: Patient noted to have a small clonal population of monoclonal B-cells on his peripheral blood flow cytometry highly suggestive of CLL.  All of his other laboratory work including BCR-ABL mutation are either negative or within normal limits.  No intervention is needed at this time.  Patient does not require bone marrow biopsy.  Return to clinic in 6 months with repeat laboratory work and video-assisted telemedicine visit.  I provided 20 minutes of face-to-face video visit time during this encounter which included chart review, counseling, and coordination of care as documented above.    Patient expressed understanding and was in agreement with this plan. He also understands that He can call clinic at any time with any questions, concerns, or complaints.    Cancer Staging  CLL (chronic lymphocytic leukemia) (HCC) Staging form: Chronic Lymphocytic Leukemia / Small Lymphocytic Lymphoma, AJCC 8th Edition - Clinical stage from 09/11/2022: Modified Rai Stage 0 (Modified Rai risk: Low, Lymphocytosis: Present, Adenopathy: Absent, Organomegaly: Absent, Anemia: Absent, Thrombocytopenia: Absent) - Signed by Scott Ruths, MD on 09/11/2022 Stage prefix: Initial diagnosis   Scott Ruths, MD   09/11/2022 3:50 PM

## 2023-03-13 ENCOUNTER — Inpatient Hospital Stay: Payer: 59 | Attending: Oncology

## 2023-03-13 DIAGNOSIS — R109 Unspecified abdominal pain: Secondary | ICD-10-CM | POA: Diagnosis not present

## 2023-03-13 DIAGNOSIS — D72829 Elevated white blood cell count, unspecified: Secondary | ICD-10-CM | POA: Diagnosis present

## 2023-03-13 DIAGNOSIS — C9110 Chronic lymphocytic leukemia of B-cell type not having achieved remission: Secondary | ICD-10-CM

## 2023-03-13 LAB — CBC WITH DIFFERENTIAL (CANCER CENTER ONLY)
Abs Immature Granulocytes: 0.06 10*3/uL (ref 0.00–0.07)
Basophils Absolute: 0.1 10*3/uL (ref 0.0–0.1)
Basophils Relative: 1 %
Eosinophils Absolute: 0.2 10*3/uL (ref 0.0–0.5)
Eosinophils Relative: 2 %
HCT: 39.2 % (ref 39.0–52.0)
Hemoglobin: 13.5 g/dL (ref 13.0–17.0)
Immature Granulocytes: 1 %
Lymphocytes Relative: 43 %
Lymphs Abs: 5.4 10*3/uL — ABNORMAL HIGH (ref 0.7–4.0)
MCH: 29.3 pg (ref 26.0–34.0)
MCHC: 34.4 g/dL (ref 30.0–36.0)
MCV: 85.2 fL (ref 80.0–100.0)
Monocytes Absolute: 1.2 10*3/uL — ABNORMAL HIGH (ref 0.1–1.0)
Monocytes Relative: 10 %
Neutro Abs: 5.2 10*3/uL (ref 1.7–7.7)
Neutrophils Relative %: 43 %
Platelet Count: 289 10*3/uL (ref 150–400)
RBC: 4.6 MIL/uL (ref 4.22–5.81)
RDW: 12.3 % (ref 11.5–15.5)
Smear Review: NORMAL
WBC Count: 12.1 10*3/uL — ABNORMAL HIGH (ref 4.0–10.5)
nRBC: 0 % (ref 0.0–0.2)

## 2023-03-14 ENCOUNTER — Inpatient Hospital Stay: Payer: 59 | Admitting: Oncology

## 2023-03-14 ENCOUNTER — Encounter: Payer: Self-pay | Admitting: Oncology

## 2023-03-14 DIAGNOSIS — C9110 Chronic lymphocytic leukemia of B-cell type not having achieved remission: Secondary | ICD-10-CM | POA: Diagnosis not present

## 2023-03-14 NOTE — Progress Notes (Signed)
[facility]  [phone] [phone]  ID: Scott Kane OB: 09-28-1965  MR#: [id number]  AOZ#:[id number]  Patient Care Team: [facility] as PCP - General Orlie Dakin Tollie Pizza, MD as Consulting Physician (Oncology)  I connected with Ursula Beath Koons on 03/14/23 at  3:30 PM EST by video enabled telemedicine visit and verified that I am speaking with the correct person using two identifiers.   I discussed the limitations, risks, security and privacy concerns of performing an evaluation and management service by telemedicine and the availability of in-person appointments. I also discussed with the patient that there may be a patient responsible charge related to this service. The patient expressed understanding and agreed to proceed.   Other persons participating in the visit and their role in the encounter: Patient, MD.  Patient's location: Home. Provider's location: Clinic.  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  INTERVAL HISTORY: Patient agreed to video-assisted telemedicine visit for further evaluation and discussion of his laboratory results.  He has intermittent right flank pain, but otherwise feels well.  He denies any recent fevers or illnesses.  He has a good appetite and denies weight loss.  He has no neurologic complaints.  He denies any chest pain, shortness of breath, cough, or hemoptysis.  He denies any nausea, vomiting, constipation, or diarrhea.  He has no urinary complaints.  Patient offers no further specific complaints today.  REVIEW OF SYSTEMS:   Review of Systems  Constitutional: Negative.  Negative for fever, malaise/fatigue and weight loss.  Respiratory: Negative.  Negative for cough, hemoptysis and shortness of breath.   Cardiovascular: Negative.  Negative for chest pain and leg swelling.  Gastrointestinal: Negative.  Negative for abdominal pain.  Genitourinary: Negative.  Negative for dysuria.  Musculoskeletal: Negative.   Negative for back pain.  Skin: Negative.  Negative for rash.  Neurological: Negative.  Negative for dizziness, focal weakness, weakness and headaches.  Psychiatric/Behavioral: Negative.  The patient is not nervous/anxious.     As per HPI. Otherwise, a complete review of systems is negative.  PAST MEDICAL HISTORY: Past Medical History:  Diagnosis Date   Hyperlipidemia    Hypertension    Kidney stone    Sciatica     PAST SURGICAL HISTORY: Past Surgical History:  Procedure Laterality Date   HEMORRHOID SURGERY     VASECTOMY      FAMILY HISTORY: Family History  Problem Relation Age of Onset   Diabetes Brother    Cancer Maternal Aunt    Cancer Maternal Grandmother        colon cancer    ADVANCED DIRECTIVES (Y/N):  N  HEALTH MAINTENANCE: Social History   Tobacco Use   Smoking status: Never   Smokeless tobacco: Never  Vaping Use   Vaping status: Never Used  Substance Use Topics   Alcohol use: Not Currently   Drug use: Yes    Types: Marijuana     Colonoscopy:  PAP:  Bone density:  Lipid panel:  Allergies  Allergen Reactions   Amlodipine Swelling   Hydralazine Other (See Comments)    sweatubg   Hydrochlorothiazide Other (See Comments)    Cramping   Wasp Venom Swelling    Current Outpatient Medications  Medication Sig Dispense Refill   atorvastatin (LIPITOR) 20 MG tablet Take 20 mg by mouth daily.     cephALEXin (KEFLEX) 500 MG capsule Take 1 capsule (500 mg total) by mouth 2 (two) times daily. 20 capsule 0   loratadine (CLARITIN) 10 MG tablet Take  10 mg by mouth daily.     losartan (COZAAR) 100 MG tablet Take by mouth.     spironolactone (ALDACTONE) 100 MG tablet Take by mouth.     gabapentin (NEURONTIN) 300 MG capsule Take 1 capsule by mouth 2 (two) times daily. (Patient not taking: Reported on 03/14/2023)     spironolactone (ALDACTONE) 50 MG tablet Take 50 mg by mouth daily. (Patient not taking: Reported on 08/23/2022)     No current  facility-administered medications for this visit.    OBJECTIVE: There were no vitals filed for this visit.    There is no height or weight on file to calculate BMI.    ECOG FS:0 - Asymptomatic    LAB RESULTS:  No results found for: "NA", "K", "CL", "CO2", "GLUCOSE", "BUN", "CREATININE", "CALCIUM", "PROT", "ALBUMIN", "AST", "ALT", "ALKPHOS", "BILITOT", "GFRNONAA", "GFRAA"  Lab Results  Component Value Date   WBC 12.1 (H) 03/13/2023   NEUTROABS 5.2 03/13/2023   HGB 13.5 03/13/2023   HCT 39.2 03/13/2023   MCV 85.2 03/13/2023   PLT 289 03/13/2023     STUDIES: No results found.  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  PLAN:    Likely CLL: Patient noted to have a small clonal population of monoclonal B-cells on his peripheral blood flow cytometry highly suggestive of CLL.  Previously, all of his other laboratory work including BCR-ABL mutation were either negative or within normal limits.  No intervention is needed at this time.  Patient does not require bone marrow biopsy.  Return to clinic in 1 year with repeat laboratory work and further evaluation. Flank pain: Unclear etiology.  Follow-up with primary care as needed.  I provided 20 minutes of face-to-face video visit time during this encounter which included chart review, counseling, and coordination of care as documented above.   Patient expressed understanding and was in agreement with this plan. He also understands that He can call clinic at any time with any questions, concerns, or complaints.    Cancer Staging  CLL (chronic lymphocytic leukemia) (HCC) Staging form: Chronic Lymphocytic Leukemia / Small Lymphocytic Lymphoma, AJCC 8th Edition - Clinical stage from 09/11/2022: Modified Rai Stage 0 (Modified Rai risk: Low, Lymphocytosis: Present, Adenopathy: Absent, Organomegaly: Absent, Anemia: Absent, Thrombocytopenia: Absent) - Signed by Jeralyn Ruths, MD on 09/11/2022 Stage prefix: Initial diagnosis   Jeralyn Ruths, MD    03/14/2023 4:50 PM

## 2023-03-14 NOTE — Progress Notes (Signed)
Patient states he is concerned about his blood work. Patient states his neck feels swollen for the last couple of months.

## 2023-10-01 ENCOUNTER — Encounter: Payer: Self-pay | Admitting: Oncology

## 2023-10-09 ENCOUNTER — Ambulatory Visit: Admitting: Dietician

## 2024-03-13 ENCOUNTER — Inpatient Hospital Stay: Payer: 59 | Admitting: Oncology

## 2024-03-13 ENCOUNTER — Inpatient Hospital Stay: Payer: 59 | Attending: Oncology

## 2024-03-13 ENCOUNTER — Encounter: Payer: Self-pay | Admitting: Oncology

## 2024-03-13 ENCOUNTER — Other Ambulatory Visit: Payer: Self-pay

## 2024-03-13 ENCOUNTER — Other Ambulatory Visit: Payer: Self-pay | Admitting: *Deleted

## 2024-03-13 VITALS — BP 113/78 | HR 64 | Temp 97.4°F | Resp 18 | Wt 188.6 lb

## 2024-03-13 DIAGNOSIS — D72829 Elevated white blood cell count, unspecified: Secondary | ICD-10-CM

## 2024-03-13 DIAGNOSIS — D72820 Lymphocytosis (symptomatic): Secondary | ICD-10-CM | POA: Diagnosis not present

## 2024-03-13 DIAGNOSIS — C9110 Chronic lymphocytic leukemia of B-cell type not having achieved remission: Secondary | ICD-10-CM

## 2024-03-13 LAB — CBC WITH DIFFERENTIAL/PLATELET
Abs Immature Granulocytes: 0.03 K/uL (ref 0.00–0.07)
Basophils Absolute: 0.1 K/uL (ref 0.0–0.1)
Basophils Relative: 1 %
Eosinophils Absolute: 0.2 K/uL (ref 0.0–0.5)
Eosinophils Relative: 3 %
HCT: 40.8 % (ref 39.0–52.0)
Hemoglobin: 13.8 g/dL (ref 13.0–17.0)
Immature Granulocytes: 0 %
Lymphocytes Relative: 41 %
Lymphs Abs: 3.3 K/uL (ref 0.7–4.0)
MCH: 29.7 pg (ref 26.0–34.0)
MCHC: 33.8 g/dL (ref 30.0–36.0)
MCV: 87.9 fL (ref 80.0–100.0)
Monocytes Absolute: 0.7 K/uL (ref 0.1–1.0)
Monocytes Relative: 9 %
Neutro Abs: 3.6 K/uL (ref 1.7–7.7)
Neutrophils Relative %: 46 %
Platelets: 226 K/uL (ref 150–400)
RBC: 4.64 MIL/uL (ref 4.22–5.81)
RDW: 12.5 % (ref 11.5–15.5)
WBC: 7.9 K/uL (ref 4.0–10.5)
nRBC: 0 % (ref 0.0–0.2)

## 2024-03-13 NOTE — Progress Notes (Signed)
 Patient is doing ok, no new questions for the doctor today.

## 2024-03-13 NOTE — Progress Notes (Signed)
 [place] the [place]? [facility]  [phone] [phone]  ID: Scott Kane Jun OB: 1965-09-07  MR#: [id number]  RDW#:[id number]  Patient Care Team: [facility] as PCP - General Jacobo Evalene PARAS, MD as Consulting Physician (Oncology)   CHIEF COMPLAINT: Monoclonal B-cell lymphocytosis.  INTERVAL HISTORY: Patient returns to clinic today for repeat laboratory work and yearly evaluation.  He continues to feel well and remains asymptomatic. He denies any recent fevers or illnesses.  He has a good appetite and denies weight loss.  He has no neurologic complaints.  He denies any chest pain, shortness of breath, cough, or hemoptysis.  He denies any nausea, vomiting, constipation, or diarrhea.  He has no urinary complaints.  Patient offers no specific complaints today.  REVIEW OF SYSTEMS:   Review of Systems  Constitutional: Negative.  Negative for fever, malaise/fatigue and weight loss.  Respiratory: Negative.  Negative for cough, hemoptysis and shortness of breath.   Cardiovascular: Negative.  Negative for chest pain and leg swelling.  Gastrointestinal: Negative.  Negative for abdominal pain.  Genitourinary: Negative.  Negative for dysuria.  Musculoskeletal: Negative.  Negative for back pain.  Skin: Negative.  Negative for rash.  Neurological: Negative.  Negative for dizziness, focal weakness, weakness and headaches.  Psychiatric/Behavioral: Negative.  The patient is not nervous/anxious.     As per HPI. Otherwise, a complete review of systems is negative.  PAST MEDICAL HISTORY: Past Medical History:  Diagnosis Date   Hyperlipidemia    Hypertension    Kidney stone    Sciatica     PAST SURGICAL HISTORY: Past Surgical History:  Procedure Laterality Date   HEMORRHOID SURGERY     VASECTOMY      FAMILY HISTORY: Family History  Problem Relation Age of Onset   Diabetes Brother    Cancer Maternal Aunt    Cancer  Maternal Grandmother        colon cancer    ADVANCED DIRECTIVES (Y/N):  N  HEALTH MAINTENANCE: Social History   Tobacco Use   Smoking status: Never   Smokeless tobacco: Never  Vaping Use   Vaping status: Never Used  Substance Use Topics   Alcohol use: Not Currently   Drug use: Yes    Types: Marijuana     Colonoscopy:  PAP:  Bone density:  Lipid panel:  Allergies  Allergen Reactions   Amlodipine Swelling   Hydralazine Other (See Comments)    sweatubg   Hydrochlorothiazide Other (See Comments)    Cramping   Wasp Venom Swelling    Current Outpatient Medications  Medication Sig Dispense Refill   atorvastatin (LIPITOR) 20 MG tablet Take 20 mg by mouth daily.     cephALEXin  (KEFLEX ) 500 MG capsule Take 1 capsule (500 mg total) by mouth 2 (two) times daily. 20 capsule 0   loratadine (CLARITIN) 10 MG tablet Take 10 mg by mouth daily.     losartan (COZAAR) 100 MG tablet Take by mouth.     spironolactone (ALDACTONE) 50 MG tablet Take 50 mg by mouth daily.     gabapentin (NEURONTIN) 300 MG capsule Take 1 capsule by mouth 2 (two) times daily. (Patient not taking: Reported on 03/13/2024)     No current facility-administered medications for this visit.    OBJECTIVE: Vitals:   03/13/24 1045  BP: 113/78  Pulse: 64  Resp: 18  Temp: (!) 97.4 F (36.3 C)  SpO2: 98%      Body mass index is 29.54 kg/m.  ECOG FS:0 - Asymptomatic  General: Well-developed, well-nourished, no acute distress. Eyes: Pink conjunctiva, anicteric sclera. HEENT: Normocephalic, moist mucous membranes. Lungs: No audible wheezing or coughing. Heart: Regular rate and rhythm. Abdomen: Soft, nontender, no obvious distention. Musculoskeletal: No edema, cyanosis, or clubbing. Neuro: Alert, answering all questions appropriately. Cranial nerves grossly intact. Skin: No rashes or petechiae noted. Psych: Normal affect.   LAB RESULTS:  No results found for: NA, K, CL, CO2, GLUCOSE, BUN,  CREATININE, CALCIUM, PROT, ALBUMIN, AST, ALT, ALKPHOS, BILITOT, GFRNONAA, GFRAA  Lab Results  Component Value Date   WBC 7.9 03/13/2024   NEUTROABS 3.6 03/13/2024   HGB 13.8 03/13/2024   HCT 40.8 03/13/2024   MCV 87.9 03/13/2024   PLT 226 03/13/2024     STUDIES: No results found.  ASSESSMENT: Monoclonal B-cell lymphocytosis.  PLAN:    Monoclonal B-cell lymphocytosis: Patient noted to have a small clonal population (26%) of monoclonal B-cells on his peripheral blood flow cytometry.  Differential diagnosis includes CLL and mantle cell lymphoma.  Previously, all of his other laboratory work including BCR-ABL mutation were either negative or within normal limits.  Patient's total white blood cell count is within normal limits today.  No intervention is needed at this time.  Return to clinic in 1 year for repeat laboratory work and evaluation by APP.    I spent a total of 20 minutes reviewing chart data, face-to-face evaluation with the patient, counseling and coordination of care as detailed above.   Patient expressed understanding and was in agreement with this plan. He also understands that He can call clinic at any time with any questions, concerns, or complaints.    Cancer Staging  CLL (chronic lymphocytic leukemia) (HCC) Staging form: Chronic Lymphocytic Leukemia / Small Lymphocytic Lymphoma, AJCC 8th Edition - Clinical stage from 09/11/2022: Modified Rai Stage 0 (Modified Rai risk: Low, Lymphocytosis: Present, Adenopathy: Absent, Organomegaly: Absent, Anemia: Absent, Thrombocytopenia: Absent) - Signed by Jacobo Evalene PARAS, MD on 09/11/2022 Stage prefix: Initial diagnosis   Evalene PARAS Jacobo, MD   03/13/2024 10:55 AM

## 2025-03-15 ENCOUNTER — Inpatient Hospital Stay

## 2025-03-22 ENCOUNTER — Inpatient Hospital Stay: Admitting: Nurse Practitioner
